# Patient Record
Sex: Female | Born: 1990 | Race: White | Hispanic: No | Marital: Married | Smoking: Never smoker
Health system: Southern US, Community
[De-identification: ages and names within clinical notes are randomized; demographics above are authoritative.]

---

## 2019-09-10 ENCOUNTER — Other Ambulatory Visit: Payer: Self-pay

## 2019-09-10 ENCOUNTER — Encounter (HOSPITAL_BASED_OUTPATIENT_CLINIC_OR_DEPARTMENT_OTHER): Payer: Self-pay | Admitting: Emergency Medicine

## 2019-09-10 ENCOUNTER — Emergency Department (HOSPITAL_BASED_OUTPATIENT_CLINIC_OR_DEPARTMENT_OTHER)
Admission: EM | Admit: 2019-09-10 | Discharge: 2019-09-10 | Disposition: A | Discharge status: Home / Self Care | Payer: BLUE CROSS/BLUE SHIELD | Attending: Emergency Medicine | Admitting: Emergency Medicine

## 2019-09-10 DIAGNOSIS — R197 Diarrhea, unspecified: Secondary | ICD-10-CM | POA: Diagnosis not present

## 2019-09-10 DIAGNOSIS — R112 Nausea with vomiting, unspecified: Secondary | ICD-10-CM | POA: Insufficient documentation

## 2019-09-10 LAB — COMPREHENSIVE METABOLIC PANEL WITH GFR
ALT: 54 U/L — ABNORMAL HIGH (ref 0–44)
AST: 29 U/L (ref 15–41)
Albumin: 5.1 g/dL — ABNORMAL HIGH (ref 3.5–5.0)
Alkaline Phosphatase: 56 U/L (ref 38–126)
Anion gap: 13 (ref 5–15)
BUN: 13 mg/dL (ref 6–20)
CO2: 25 mmol/L (ref 22–32)
Calcium: 9.5 mg/dL (ref 8.9–10.3)
Chloride: 102 mmol/L (ref 98–111)
Creatinine, Ser: 0.87 mg/dL (ref 0.44–1.00)
GFR calc Af Amer: >60 mL/min
GFR calc non Af Amer: >60 mL/min
Glucose, Bld: 146 mg/dL — ABNORMAL HIGH (ref 70–99)
Potassium: 3.9 mmol/L (ref 3.5–5.1)
Sodium: 140 mmol/L (ref 135–145)
Total Bilirubin: 1 mg/dL (ref 0.3–1.2)
Total Protein: 8.5 g/dL — ABNORMAL HIGH (ref 6.5–8.1)

## 2019-09-10 LAB — CBC WITH DIFFERENTIAL/PLATELET
Abs Immature Granulocytes: 0.03 10*3/uL (ref 0.00–0.07)
Basophils Absolute: 0 10*3/uL (ref 0.0–0.1)
Basophils Relative: 0 %
Eosinophils Absolute: 0 10*3/uL (ref 0.0–0.5)
Eosinophils Relative: 0 %
HCT: 52.8 % — ABNORMAL HIGH (ref 36.0–46.0)
Hemoglobin: 17.4 g/dL — ABNORMAL HIGH (ref 12.0–15.0)
Immature Granulocytes: 0 %
Lymphocytes Relative: 2 %
Lymphs Abs: 0.2 10*3/uL — ABNORMAL LOW (ref 0.7–4.0)
MCH: 29.7 pg (ref 26.0–34.0)
MCHC: 33 g/dL (ref 30.0–36.0)
MCV: 90.3 fL (ref 80.0–100.0)
Monocytes Absolute: 0.4 10*3/uL (ref 0.1–1.0)
Monocytes Relative: 4 %
Neutro Abs: 10.4 10*3/uL — ABNORMAL HIGH (ref 1.7–7.7)
Neutrophils Relative %: 94 %
Platelets: 235 10*3/uL (ref 150–400)
RBC: 5.85 MIL/uL — ABNORMAL HIGH (ref 3.87–5.11)
RDW: 12.3 % (ref 11.5–15.5)
WBC: 11.1 10*3/uL — ABNORMAL HIGH (ref 4.0–10.5)
nRBC: 0 % (ref 0.0–0.2)

## 2019-09-10 LAB — URINALYSIS, ROUTINE W REFLEX MICROSCOPIC
Bilirubin Urine: NEGATIVE
Glucose, UA: NEGATIVE mg/dL
Hgb urine dipstick: NEGATIVE
Ketones, ur: NEGATIVE mg/dL
Leukocytes,Ua: NEGATIVE
Nitrite: NEGATIVE
Protein, ur: NEGATIVE mg/dL
Specific Gravity, Urine: 1.02 (ref 1.005–1.030)
pH: 6.5 (ref 5.0–8.0)

## 2019-09-10 LAB — LIPASE, BLOOD: Lipase: 20 U/L (ref 11–51)

## 2019-09-10 LAB — PREGNANCY, URINE: Preg Test, Ur: NEGATIVE

## 2019-09-10 MED ORDER — SODIUM CHLORIDE 0.9 % IV BOLUS
1000.0000 mL | Freq: Once | INTRAVENOUS | Status: AC
  Administered 2019-09-10: 1000 mL via INTRAVENOUS

## 2019-09-10 MED ORDER — ONDANSETRON HCL 4 MG/2ML IJ SOLN: 4.0000 mg | Freq: Once | INTRAMUSCULAR | Status: DC

## 2019-09-10 MED ORDER — ONDANSETRON HCL 4 MG/2ML IJ SOLN
4.0000 mg | Freq: Once | INTRAMUSCULAR | Status: AC
  Administered 2019-09-10: 4 mg via INTRAVENOUS
  Filled 2019-09-10: qty 2

## 2019-09-10 MED ORDER — ONDANSETRON HCL 4 MG/2ML IJ SOLN
4.0000 mg | Freq: Once | INTRAMUSCULAR | Status: AC
  Administered 2019-09-10: 4 mg via INTRAVENOUS

## 2019-09-10 MED ORDER — ONDANSETRON 4 MG PO TBDP: ORAL_TABLET | ORAL | 0 refills | Status: AC

## 2019-09-10 MED ORDER — ONDANSETRON HCL 4 MG/2ML IJ SOLN
INTRAMUSCULAR | Status: AC
  Filled 2019-09-10: qty 2

## 2019-09-10 NOTE — ED Notes (Signed)
Pt discharged to home. Discharge instructions have been discussed with patient and/or family members. Pt verbally acknowledges understanding d/c instructions, and endorses comprehension to checkout at registration before leaving.  °

## 2019-09-10 NOTE — ED Provider Notes (Signed)
[facility] EMERGENCY DEPARTMENT Provider Note   CSN: [id number] Arrival date & time: 09/10/19  1008     History Chief Complaint  Patient presents with  . Emesis    N/V/D    Vickie Wright is a 29 y.o. female.  Patient is a 29 year old female who presents with nausea vomiting diarrhea.  That started last night and she has had multiple episodes of vomiting since that time.  Her emesis is nonbilious and for the most part nonbloody.  During the last couple episodes of emesis she has had a couple specks of blood in it.  Her diarrhea is watery.  She has some intermittent abdominal cramping but denies any current abdominal pain.  No known fevers.  No urinary symptoms.  Her mother and her daughter are also sick with vomiting and diarrhea.  She says that they did not eat the same foods however.  She has not been able to take anything at home due to the vomiting.        History reviewed. No pertinent past medical history.  There are no problems to display for this patient.   History reviewed. No pertinent surgical history.   OB History   No obstetric history on file.     History reviewed. No pertinent family history.  Social History   Tobacco Use  . Smoking status: Never Smoker  . Smokeless tobacco: Never Used  Substance Use Topics  . Alcohol use: Yes  . Drug use: Never    Home Medications Prior to Admission medications   Medication Sig Start Date End Date Taking? Authorizing Provider  ondansetron (ZOFRAN ODT) 4 MG disintegrating tablet 4mg  ODT q4 hours prn nausea/vomit 09/10/19   11/10/19, MD    Allergies    Augmentin [amoxicillin-pot clavulanate]  Review of Systems   Review of Systems  Constitutional: Negative for chills, diaphoresis, fatigue and fever.  HENT: Negative for congestion, rhinorrhea and sneezing.   Eyes: Negative.   Respiratory: Negative for cough, chest tightness and shortness of breath.   Cardiovascular: Negative for chest pain  and leg swelling.  Gastrointestinal: Positive for diarrhea, nausea and vomiting. Negative for abdominal pain and blood in stool.  Genitourinary: Negative for difficulty urinating, flank pain, frequency and hematuria.  Musculoskeletal: Negative for arthralgias and back pain.  Skin: Negative for rash.  Neurological: Negative for dizziness, speech difficulty, weakness, numbness and headaches.    Physical Exam Updated Vital Signs BP 129/79 (BP Location: Right Arm)   Pulse 99   Temp 98 F (36.7 C) (Oral)   Resp 16   Ht 5\' 5"  (1.651 m)   Wt 77.1 kg   SpO2 100%   BMI 28.29 kg/m   Physical Exam Constitutional:      Appearance: She is well-developed.  HENT:     Head: Normocephalic and atraumatic.  Eyes:     Pupils: Pupils are equal, round, and reactive to light.  Cardiovascular:     Rate and Rhythm: Regular rhythm. Tachycardia present.     Heart sounds: Normal heart sounds.  Pulmonary:     Effort: Pulmonary effort is normal. No respiratory distress.     Breath sounds: Normal breath sounds. No wheezing or rales.  Chest:     Chest wall: No tenderness.  Abdominal:     General: Bowel sounds are normal.     Palpations: Abdomen is soft.     Tenderness: There is no abdominal tenderness. There is no guarding or rebound.  Musculoskeletal:  General: Normal range of motion.     Cervical back: Normal range of motion and neck supple.  Lymphadenopathy:     Cervical: No cervical adenopathy.  Skin:    General: Skin is warm and dry.     Findings: No rash.  Neurological:     Mental Status: She is alert and oriented to person, place, and time.     ED Results / Procedures / Treatments   Labs (all labs ordered are listed, but only abnormal results are displayed) Labs Reviewed  CBC WITH DIFFERENTIAL/PLATELET - Abnormal; Notable for the following components:      Result Value   WBC 11.1 (*)    RBC 5.85 (*)    Hemoglobin 17.4 (*)    HCT 52.8 (*)    Neutro Abs 10.4 (*)    Lymphs  Abs 0.2 (*)    All other components within normal limits  COMPREHENSIVE METABOLIC PANEL - Abnormal; Notable for the following components:   Glucose, Bld 146 (*)    Total Protein 8.5 (*)    Albumin 5.1 (*)    ALT 54 (*)    All other components within normal limits  URINALYSIS, ROUTINE W REFLEX MICROSCOPIC  PREGNANCY, URINE  LIPASE, BLOOD    EKG None  Radiology No results found.  Procedures Procedures (including critical care time)  Medications Ordered in ED Medications  ondansetron (ZOFRAN) injection 4 mg (4 mg Intravenous Given 09/10/19 1107)  sodium chloride 0.9 % bolus 1,000 mL (0 mLs Intravenous Stopped 09/10/19 1241)  ondansetron (ZOFRAN) injection 4 mg (4 mg Intravenous Given 09/10/19 1322)    ED Course  I have reviewed the triage vital signs and the nursing notes.  Pertinent labs & imaging results that were available during my care of the patient were reviewed by me and considered in my medical decision making (see chart for details).    MDM Rules/Calculators/A&P                      Patient is a 29 year old female who presents with nausea vomiting diarrhea.  She has 2 other family members with similar symptoms.  Her labs show mild elevation in her glucose and her ALT.  Her other labs are nonconcerning.  She has no evidence of UTI.  Her urine pregnancy test is negative.  Her abdomen is nontender on my exam.  Her symptoms are most consistent with gastroenteritis, likely viral.  She was feeling much better after IV fluids and antiemetics in the ED.  She is able to tolerate oral fluids without vomiting.  She was discharged home in good condition.  She was given a prescription for Zofran.  I did advise her she can follow-up with her primary care provider about her mild elevation in her glucose as well as her ALT.  Return precautions were given. Final Clinical Impression(s) / ED Diagnoses Final diagnoses:  Nausea vomiting and diarrhea    Rx / DC Orders ED Discharge Orders          Ordered    ondansetron (ZOFRAN ODT) 4 MG disintegrating tablet     09/10/19 1322           Malvin Johns, MD 09/10/19 1325

## 2019-09-10 NOTE — Discharge Instructions (Signed)
Your glucose and one of your liver enzymes were mildly elevated.  These need to be rechecked by your primary care doctor.  Return here as needed if you have any worsening symptoms.  Use a clear liquid diet for the remainder of today and slowly progress her diet tomorrow.

## 2019-09-10 NOTE — ED Triage Notes (Signed)
Patient states that she is having N/v/D since last night at about 2300 - pt states that her mother and daughter are sick. Pt also reports mid epigastric stomach pain. She feels maybe some food poisoning

## 2019-10-29 ENCOUNTER — Emergency Department (INDEPENDENT_AMBULATORY_CARE_PROVIDER_SITE_OTHER): Payer: BC Managed Care – PPO

## 2019-10-29 ENCOUNTER — Other Ambulatory Visit: Payer: Self-pay

## 2019-10-29 ENCOUNTER — Emergency Department
Admission: EM | Admit: 2019-10-29 | Discharge: 2019-10-29 | Disposition: A | Discharge status: Home / Self Care | Payer: BC Managed Care – PPO | Source: Home / Self Care | Attending: Family Medicine | Admitting: Family Medicine

## 2019-10-29 DIAGNOSIS — Z8616 Personal history of COVID-19: Secondary | ICD-10-CM

## 2019-10-29 DIAGNOSIS — R221 Localized swelling, mass and lump, neck: Secondary | ICD-10-CM

## 2019-10-29 DIAGNOSIS — R1310 Dysphagia, unspecified: Secondary | ICD-10-CM

## 2019-10-29 NOTE — ED Triage Notes (Signed)
Pt covid pos in April. Recovered for 2 weeks then got sick with something else. Lingering cough. Now c/o RT sided lump in throat, hurts to swallow. Pain 3/10 Delsym and mucinex prn.

## 2019-10-29 NOTE — Discharge Instructions (Addendum)
May continue using soothing lozenges

## 2019-10-29 NOTE — ED Provider Notes (Signed)
[facility]    CSN: [id number] Arrival date & time: 10/29/19  9518      History   Chief Complaint Chief Complaint  Patient presents with  . Lump in throat    RT side    HPI Colena Ketterman is a 29 y.o. female.   Patient reports that she recovered from [place] infection in April, and two weeks later contracted another URI.  She now has a lingering mild cough.  During the past 4 days she has had a sensation of a lump in the right side of her throat.  She has pain on the right side when swallowing.     The history is provided by the patient.    History reviewed. No pertinent past medical history.  There are no problems to display for this patient.   History reviewed. No pertinent surgical history.  OB History   No obstetric history on file.      Home Medications    Prior to Admission medications   Medication Sig Start Date End Date Taking? Authorizing Provider  ondansetron (ZOFRAN ODT) 4 MG disintegrating tablet 4mg  ODT q4 hours prn nausea/vomit 09/10/19   Malvin Johns, MD    Family History History reviewed. No pertinent family history.  Social History Social History   Tobacco Use  . Smoking status: Never Smoker  . Smokeless tobacco: Never Used  Substance Use Topics  . Alcohol use: Yes  . Drug use: Never     Allergies   Augmentin [amoxicillin-pot clavulanate]   Review of Systems Review of Systems ? sore throat No cough No pleuritic pain No wheezing No nasal congestion No post-nasal drainage No sinus pain/pressure No itchy/red eyes No earache No hemoptysis No SOB No fever/chills No nausea No vomiting No abdominal pain No diarrhea No urinary symptoms No skin rash No fatigue No myalgias No headache    Physical Exam Triage Vital Signs ED Triage Vitals [10/29/19 0940]  Enc Vitals Group     BP      Pulse Rate 99     Resp 18     Temp      Temp src      SpO2 98 %     Weight      Height      Head Circumference      Peak Flow      Pain Score      Pain Loc      Pain Edu?      Excl. in [place]?    No data found.  Updated Vital Signs Pulse 99   Resp 18   SpO2 98%   Visual Acuity Right Eye Distance:   Left Eye Distance:   Bilateral Distance:    Right Eye Near:   Left Eye Near:    Bilateral Near:     Physical Exam Nursing notes and Vital Signs reviewed. Appearance:  Patient appears stated age, and in no acute distress Eyes:  Pupils are equal, round, and reactive to light and accomodation.  Extraocular movement is intact.  Conjunctivae are not inflamed  Ears:  Canals normal.  Tympanic membranes normal.  Nose:  Normal turbinates.  No sinus tenderness.   Mouth:  Normal Pharynx:  Normal Neck:  Supple.  No adenopathy or thyromegaly.  Lungs:  Clear to auscultation.  Breath sounds are equal.  Moving air well. Heart:  Regular rate and rhythm without murmurs, rubs, or gallops.  Abdomen:  Nontender without masses or hepatosplenomegaly.  Bowel sounds are present.  No CVA or flank tenderness.  Extremities:  No edema.  Skin:  No rash present.   UC Treatments / Results  Labs (all labs ordered are listed, but only abnormal results are displayed) Labs Reviewed - No data to display  EKG   Radiology DG Neck Soft Tissue  Result Date: 10/29/2019 CLINICAL DATA:  History of COVID-19 positivity with lingering cough and throat lump EXAM: NECK SOFT TISSUES - 1+ VIEW COMPARISON:  None. FINDINGS: Epiglottis and aryepiglottic folds are within normal limits. Some soft tissue prominence is noted in the region of the vallecula anteriorly. No prevertebral soft tissue abnormality is noted. IMPRESSION: Soft tissue prominence along the vallecular anteriorly. This is of uncertain etiology however possibility of underlying mass could not be totally excluded. CT of the neck with contrast is recommended. Electronically Signed   By: Alcide Clever M.D.   On: 10/29/2019 12:25    Procedures Procedures (including critical care  time)  Medications Ordered in UC Medications - No data to display  Initial Impression / Assessment and Plan / UC Course  I have reviewed the triage vital signs and the nursing notes.  Pertinent labs & imaging results that were available during my care of the patient were reviewed by me and considered in my medical decision making (see chart for details).    Note abnormal finding on x-ray of soft tissues neck. Recommend follow-up with ENT to schedule CT scan and further evaluation.   Final Clinical Impressions(s) / UC Diagnoses   Final diagnoses:  Odynophagia     Discharge Instructions     May continue using soothing lozenges    ED Prescriptions    None        Lattie Haw, MD 10/30/19 [phone]

## 2021-06-17 IMAGING — DX DG NECK SOFT TISSUE
2 series · 2 of 2 positions shown · non-contrast
Comparison: None.

CLINICAL DATA: History of [id number] positivity with lingering cough
and throat lump

EXAM:
NECK SOFT TISSUES - 1+ VIEW

[neck lat]
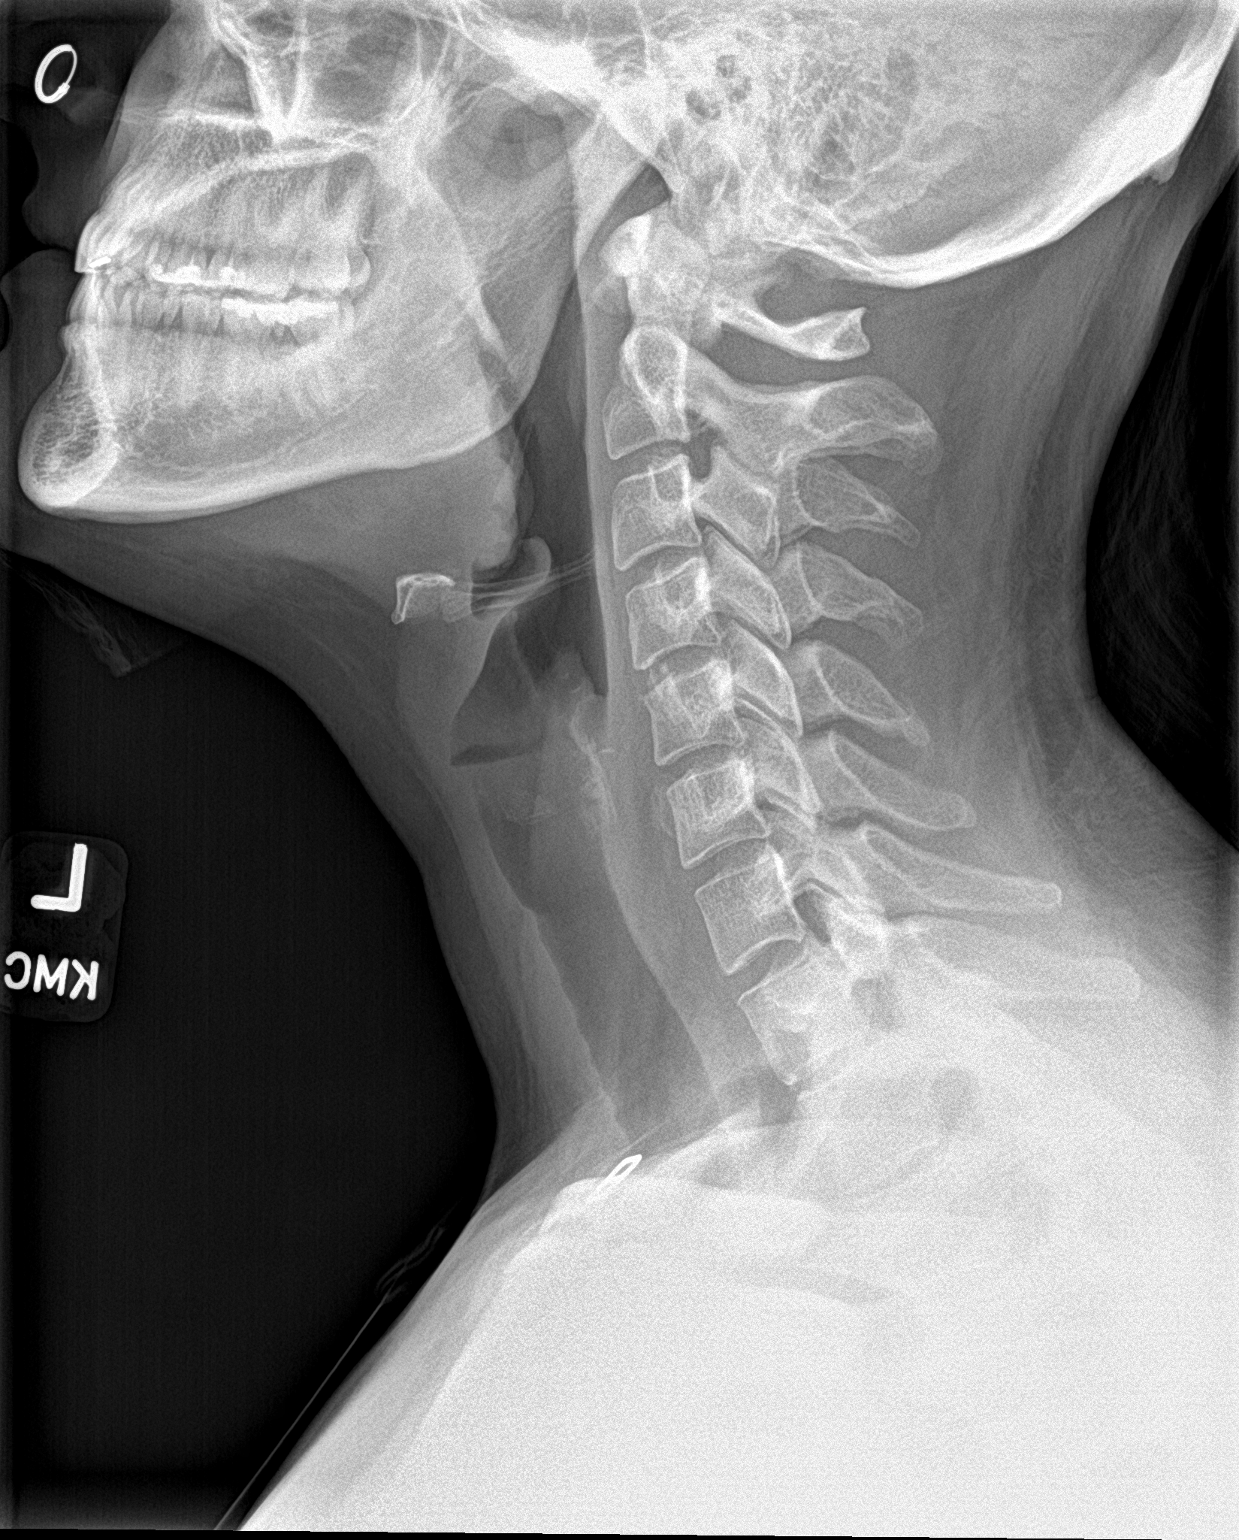

[neck ap]
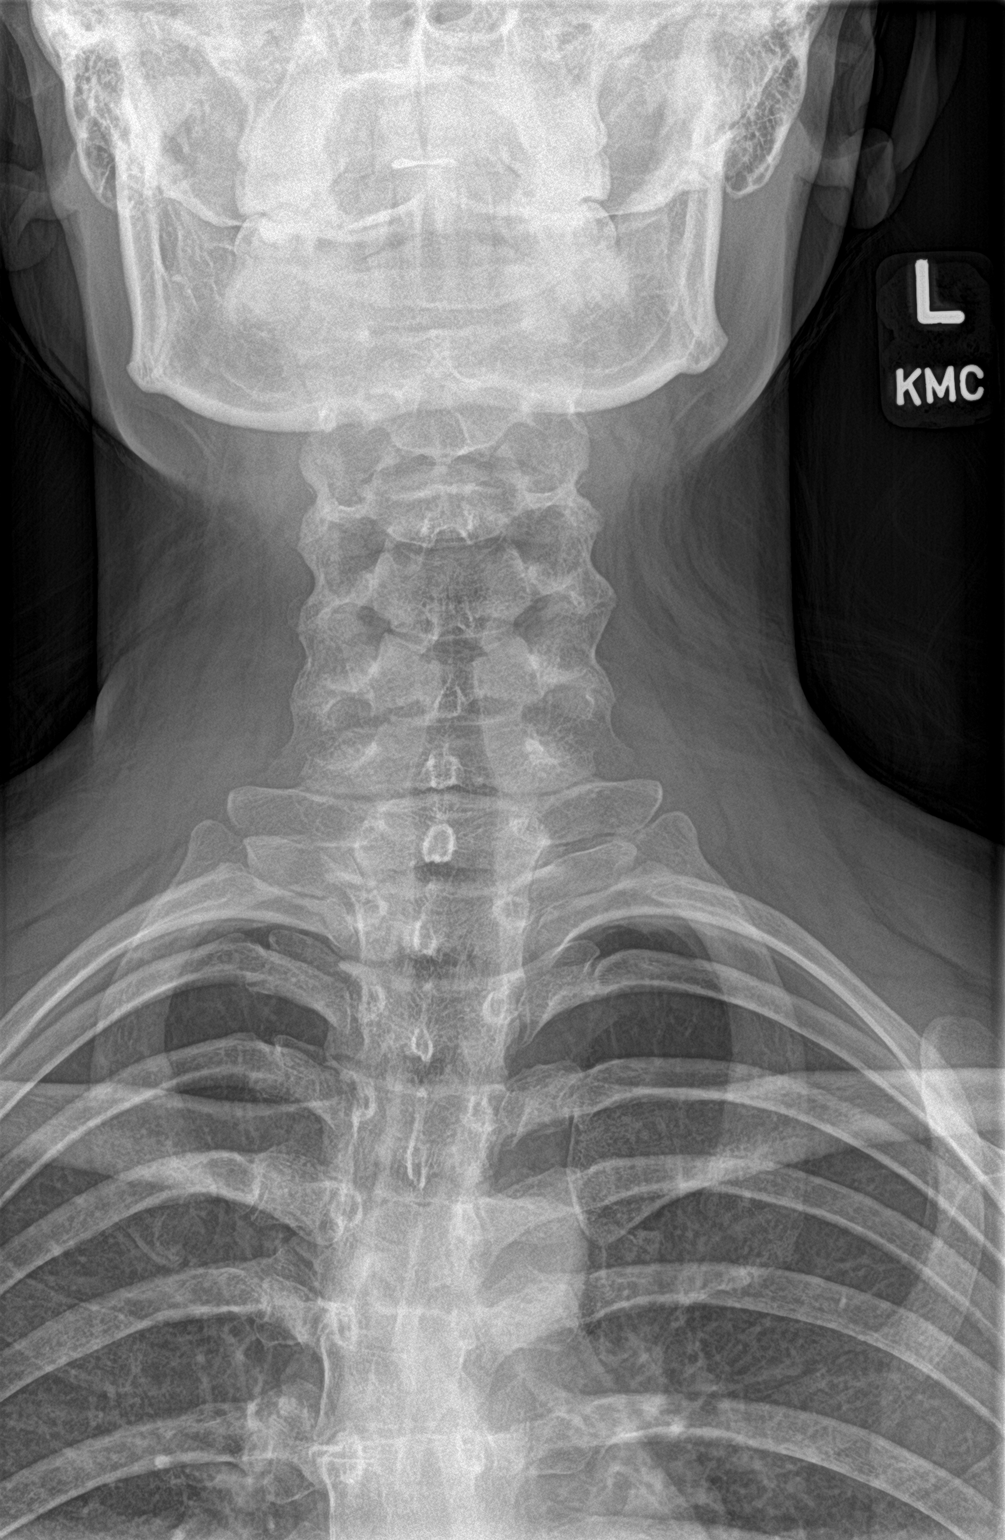

[2 of 2 positions shown; findings below may reference images not displayed]

FINDINGS: Epiglottis and aryepiglottic folds are within normal limits. Some
soft tissue prominence is noted in the region of the vallecula
anteriorly. No prevertebral soft tissue abnormality is noted.
IMPRESSION: Soft tissue prominence along the vallecular anteriorly. This is of
uncertain etiology however possibility of underlying mass could not
be totally excluded. CT of the neck with contrast is recommended.
